# Patient Record
Sex: Male | Born: 2000 | Race: Black or African American | Hispanic: No | Marital: Single | Smoking: Never smoker
Health system: Southern US, Community
[De-identification: ages and names within clinical notes are randomized; demographics above are authoritative.]

## PROBLEM LIST (undated history)

## (undated) DIAGNOSIS — J45909 Unspecified asthma, uncomplicated: Secondary | ICD-10-CM

---
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
## 2018-03-11 ENCOUNTER — Encounter: Payer: Self-pay | Admitting: Pediatrics

## 2018-03-11 ENCOUNTER — Ambulatory Visit (INDEPENDENT_AMBULATORY_CARE_PROVIDER_SITE_OTHER): Payer: Medicaid Other | Admitting: Pediatrics

## 2018-03-11 VITALS — Temp 98.5°F | Wt 220.8 lb

## 2018-03-11 DIAGNOSIS — J301 Allergic rhinitis due to pollen: Secondary | ICD-10-CM

## 2018-03-11 DIAGNOSIS — R300 Dysuria: Secondary | ICD-10-CM

## 2018-03-11 DIAGNOSIS — R21 Rash and other nonspecific skin eruption: Secondary | ICD-10-CM

## 2018-03-11 DIAGNOSIS — R369 Urethral discharge, unspecified: Secondary | ICD-10-CM

## 2018-03-11 MED ORDER — CEFTRIAXONE SODIUM 1 G IJ SOLR
250.0000 mg | Freq: Once | INTRAMUSCULAR | Status: AC
  Administered 2018-03-11: 250 mg via INTRAMUSCULAR

## 2018-03-11 MED ORDER — PENICILLIN G BENZATHINE 1200000 UNIT/2ML IM SUSP
2.4000 10*6.[IU] | Freq: Once | INTRAMUSCULAR | Status: AC
  Administered 2018-03-11: 2.4 10*6.[IU] via INTRAMUSCULAR

## 2018-03-11 MED ORDER — AZITHROMYCIN 500 MG PO TABS
1000.0000 mg | ORAL_TABLET | Freq: Once | ORAL | Status: AC
  Administered 2018-03-11: 1000 mg via ORAL

## 2018-03-11 MED ORDER — AZITHROMYCIN 250 MG PO TABS
1000.0000 mg | ORAL_TABLET | Freq: Every day | ORAL | Status: DC
  Administered 2018-03-11: 1000 mg via ORAL

## 2018-03-11 MED ORDER — CETIRIZINE HCL 10 MG PO TABS: 10.0000 mg | ORAL_TABLET | Freq: Every day | ORAL | 5 refills | Status: DC

## 2018-03-11 MED ORDER — FLUTICASONE PROPIONATE 50 MCG/ACT NA SUSP: 1.0000 | Freq: Every day | NASAL | 5 refills | Status: AC

## 2018-03-11 NOTE — Progress Notes (Signed)
Subjective:     Brian Gamble, is a 17 y.o. male  HPI  Chief Complaint  Patient presents with  . Foot    Both for a 2 weeks now  . 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  HENT: Negative for mouth sores, rhinorrhea and sore throat.   Eyes: Negative for discharge and redness.  Respiratory: Negative for cough.   Gastrointestinal: Negative for diarrhea and vomiting.  Genitourinary: Positive for discharge and urgency. Negative for flank pain and hematuria.  Musculoskeletal: Positive for back pain.  Neurological: Negative for dizziness and headaches.    History and Problem List: Ernesto does not have any active problems on file.  Kanon  has a past medical history of Asthma.  The following portions of the patient's history were reviewed and updated as appropriate: allergies, current medications, past family history, past medical history, past social history, past surgical history and problem list.     Objective:     Temp 98.5 F (36.9 C) (Temporal)   Wt 220 lb 12.8 oz (100.2 kg)    Physical Exam  Constitutional: He appears well-developed and  well-nourished. No distress.  HENT:  Head: Normocephalic and atraumatic.  Nose: Nose normal.  Mouth/Throat: Oropharynx is clear and moist.  Eyes: Conjunctivae and EOM are normal. Right eye exhibits no discharge. Left eye exhibits no discharge.  Neck: Normal range of motion. No thyromegaly present.  Cardiovascular: Normal rate, regular rhythm and normal heart sounds.  No murmur heard. Pulmonary/Chest: No respiratory distress. He has no wheezes. He has no rales.  Abdominal: Soft. He exhibits no distension. There is no tenderness.  Genitourinary:  Genitourinary Comments: waterry penile discharge, erythema at urethral opening, no other lesions, no inguinal nodes   Lymphadenopathy:    He has no cervical adenopathy.  Skin: Skin is warm and dry. Rash noted.  No rash except soles and possibly palms: 1 cm macules, hyperpigmented, no pustules covering soles,        Assessment & Plan:   1. Penile discharge Probably STI No known exposure  - HIV antibody - RPR - C. trachomatis/N. gonorrhoeae RNA  IM Ceftriazone 250 mg in clinic Azithromycin 10 mg oral--in clinic  2. Dysuria  3. Seasonal allergic rhinitis due to pollen rx for Cetirizine refilled For Flonase reviewed use, rx flonase  - cetirizine (ZYRTEC) 10 MG tablet; Take 1 tablet (10 mg total) by mouth daily.  Dispense: 30 tablet; Refill: 5  5. Rash in adult On soles, ddxn: secondary syphilis, gonorrhea bacterial endocarditis, Unlikely endocarditis without murmur or systemic findings  Plan treatment for IM PCN today,  if positive PRP will need to follow titers to confirm secondary syphyllis appropriately treated, refer to public health and re-treat with weekly PCN IM  Discussed condoms,  Potential for public health referral    [phone] for results  Supportive care and return precautions reviewed.  Spent  25  minutes face to face time with patient; greater than 50% spent in counseling regarding diagnosis and treatment  plan.   Theadore Nan, MD

## 2018-03-11 NOTE — Patient Instructions (Addendum)
The best sources of general information are www.kidshealth.org and www.healthychildren.org   Both have excellent, accurate information about many topics.  !Tambien en espanol!  Use information on the internet only from trusted sites.The best websites for information for teenagers are www.youngwomensheatlh.org and www.youngmenshealthsite.org       Good video of parent-teen talk about sex and sexuality is at www.plannedparenthood.org/parents/talking-to0-kids-about-sex-and-sexuality  Excellent information about birth control is available at www.plannedparenthood.org/health-info/birth-control  Consider letting your partners know that they have been exposed to a sexually transmitted infection at the website: dontspreadit.com  You put in an email address or phone number of a partner and the website lets them know anonymously.  For Allergies:  Cetirizine works well for as need for symptoms and is not a controller medicine  Flonase in the nose helps for as needed daily symptoms and also helps to prevent allergies if used daily.   These can all be used only during allergy season

## 2018-03-14 LAB — HIV ANTIBODY (ROUTINE TESTING W REFLEX): HIV 1&2 Ab, 4th Generation: NONREACTIVE

## 2018-03-14 LAB — C. TRACHOMATIS/N. GONORRHOEAE RNA
C. TRACHOMATIS RNA, TMA: DETECTED — AB
N. gonorrhoeae RNA, TMA: DETECTED — AB

## 2018-03-14 LAB — RPR: RPR: NONREACTIVE

## 2018-03-14 NOTE — Progress Notes (Signed)
Brian Gamble notified of lab results. His feet are feeling better. Instructed to call [facility] if not completely better in one week.  Understanding stated.

## 2018-03-31 ENCOUNTER — Encounter: Payer: Self-pay | Admitting: Pediatrics

## 2018-03-31 ENCOUNTER — Ambulatory Visit (INDEPENDENT_AMBULATORY_CARE_PROVIDER_SITE_OTHER): Payer: Medicaid Other | Admitting: Pediatrics

## 2018-03-31 VITALS — Temp 97.9°F | Wt 204.0 lb

## 2018-03-31 DIAGNOSIS — R634 Abnormal weight loss: Secondary | ICD-10-CM

## 2018-03-31 DIAGNOSIS — R3129 Other microscopic hematuria: Secondary | ICD-10-CM | POA: Diagnosis not present

## 2018-03-31 DIAGNOSIS — R062 Wheezing: Secondary | ICD-10-CM

## 2018-03-31 DIAGNOSIS — K529 Noninfective gastroenteritis and colitis, unspecified: Secondary | ICD-10-CM | POA: Diagnosis not present

## 2018-03-31 LAB — CBC WITH DIFFERENTIAL/PLATELET
BASOS ABS: 28 {cells}/uL (ref 0–200)
Basophils Relative: 0.5 %
Eosinophils Absolute: 99 cells/uL (ref 15–500)
Eosinophils Relative: 1.8 %
HEMATOCRIT: 45.4 % (ref 38.5–50.0)
HEMOGLOBIN: 16.5 g/dL (ref 13.2–17.1)
LYMPHS ABS: 1628 {cells}/uL (ref 850–3900)
MCH: 30.7 pg (ref 27.0–33.0)
MCHC: 36.3 g/dL — ABNORMAL HIGH (ref 32.0–36.0)
MCV: 84.4 fL (ref 80.0–100.0)
MPV: 11 fL (ref 7.5–12.5)
Monocytes Relative: 11.6 %
NEUTROS ABS: 3108 {cells}/uL (ref 1500–7800)
NEUTROS PCT: 56.5 %
Platelets: 228 10*3/uL (ref 140–400)
RBC: 5.38 10*6/uL (ref 4.20–5.80)
RDW: 12 % (ref 11.0–15.0)
Total Lymphocyte: 29.6 %
WBC: 5.5 10*3/uL (ref 3.8–10.8)
WBCMIX: 638 {cells}/uL (ref 200–950)

## 2018-03-31 LAB — COMPREHENSIVE METABOLIC PANEL
AG RATIO: 2 (calc) (ref 1.0–2.5)
ALBUMIN MSPROF: 4.3 g/dL (ref 3.6–5.1)
ALKALINE PHOSPHATASE (APISO): 83 U/L (ref 48–230)
ALT: 14 U/L (ref 8–46)
AST: 19 U/L (ref 12–32)
BILIRUBIN TOTAL: 0.6 mg/dL (ref 0.2–1.1)
BUN: 13 mg/dL (ref 7–20)
CHLORIDE: 101 mmol/L (ref 98–110)
CO2: 28 mmol/L (ref 20–32)
Calcium: 9.4 mg/dL (ref 8.9–10.4)
Creat: 1.25 mg/dL (ref 0.60–1.26)
Globulin: 2.1 g/dL (calc) (ref 2.1–3.5)
Glucose, Bld: 78 mg/dL (ref 65–99)
POTASSIUM: 4.1 mmol/L (ref 3.8–5.1)
SODIUM: 139 mmol/L (ref 135–146)
TOTAL PROTEIN: 6.4 g/dL (ref 6.3–8.2)

## 2018-03-31 LAB — POCT URINALYSIS DIPSTICK
Bilirubin, UA: NEGATIVE
GLUCOSE UA: NEGATIVE
KETONES UA: NEGATIVE
Leukocytes, UA: NEGATIVE
Nitrite, UA: NEGATIVE
Protein, UA: POSITIVE — AB
SPEC GRAV UA: 1.02 (ref 1.010–1.025)
Urobilinogen, UA: 4 E.U./dL — AB
pH, UA: 6 (ref 5.0–8.0)

## 2018-03-31 MED ORDER — AEROCHAMBER PLUS FLO-VU MEDIUM MISC
1.0000 | Freq: Once | Status: AC
  Administered 2018-03-31: 1

## 2018-03-31 MED ORDER — ALBUTEROL SULFATE HFA 108 (90 BASE) MCG/ACT IN AERS: 2.0000 | INHALATION_SPRAY | RESPIRATORY_TRACT | 1 refills | Status: DC | PRN

## 2018-03-31 MED ORDER — ONDANSETRON 8 MG PO TBDP: 8.0000 mg | ORAL_TABLET | Freq: Three times a day (TID) | ORAL | 0 refills | Status: DC | PRN

## 2018-03-31 MED ORDER — ALBUTEROL SULFATE (2.5 MG/3ML) 0.083% IN NEBU
2.5000 mg | INHALATION_SOLUTION | Freq: Once | RESPIRATORY_TRACT | Status: AC
  Administered 2018-03-31: 2.5 mg via RESPIRATORY_TRACT

## 2018-03-31 NOTE — Progress Notes (Signed)
4

## 2018-03-31 NOTE — Patient Instructions (Signed)
Use the albuterol inhaler with spacer if wheezing or coughing.  Take the Ondansetron if you have further nausea, vomiting.  Wait 20 minutes, then start sips of clear liquids and gradually advance on quantity.   Goal is to drink one of the liter size (usually costs $1) Gatorade before bedtime tonight and at 1/2 that amount of water.  If you have increased pain or still not tolerating fluids, or other worries tonight that need medical attention, seek care at [facility]  Please keep your appointment with us for tomorrow.

## 2018-03-31 NOTE — Progress Notes (Signed)
Subjective:    Patient ID: Gardiner RamusKrystofer Aung, male    DOB: 07/08/2001, 17 y.o.   MRN: [id number]  HPI Cristal DeerChristopher is here with concern of vomiting, diarrhea and abdominal pain for 4 days. He states diarrhea twice today, improved so far from 3 times yesterday.  No blood in stool.  Report eating 3 pcs of chicken today and drinking 1 gallon of water over course of day, but vomiting it all back.  States smell of food creates nausea.  Voided 3 times so far today and recalls voiding 3 times yesterday. No medication or modifying factors. No fever.  Some abdominal pain. Occasional cough but denies feeling SOB or having chest pain. No known illness exposures. Stopped smoking 3 months ago. 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  Jacque states his other concern is weight loss.  Reports what he thinks is about a 40 pound unintended wt loss this year.  He states he normally has a big appetite but has not been able to eat much.  He is concerned.  Chart review shows recent visit for STI diagnosis and treatment.  States his partner also received treatment, 2 days later.  States no other partners and they live together.   Labs are in EHR and reviewed.  PMH, problem list, medications and allergies, family and social history reviewed and updated as indicated.  He lives with his mother, sister, his girlfriend and their 305 month old child.   Review of Systems As noted in HPI    Objective:   Physical Exam  Constitutional: He appears well-developed and well-nourished.  HENT:  Right Ear: External ear normal.  Left Ear: External ear normal.  Mouth/Throat: Oropharynx is clear and moist. No oropharyngeal exudate.  Eyes: Conjunctivae are normal. Right eye exhibits no discharge. Left eye exhibits no discharge.  Neck: Normal range of motion. Neck supple.  Cardiovascular: Normal rate, regular rhythm and normal heart sounds.  No murmur heard. Pulmonary/Chest: Effort  normal. No respiratory distress. He has wheezes. He has rales.  Good air movement but wheezes and crackles bilaterally that improve but persist after cough  Abdominal: Soft. Bowel sounds are normal. He exhibits no distension and no mass. There is tenderness (states mild tenderness on palpation in RLQ and suprapubic area; no rebound or mass). There is no rebound and no guarding. No hernia.  Skin: Skin is warm and dry.  Nursing note and vitals reviewed.  Re-examined after albuterol neb treatment with continued crackles on the left, clear on the right.  Temperature 97.9 F (36.6 C), temperature source Temporal, weight 204 lb (92.5 kg). Wt Readings from Last 3 Encounters:  03/31/18 204 lb (92.5 kg) (94 %, Z= 1.55)*  03/11/18 220 lb 12.8 oz (100.2 kg) (97 %, Z= 1.91)*  05/03/17 236 lb (107 kg) (99 %, Z= 2.23)*   * Growth percentiles are based on CDC (Boys, 2-20 Years) data.   Results for orders placed or performed in visit on 03/31/18 (from the past 48 hour(s))  POCT urinalysis dipstick     Status: Abnormal   Collection Time: 03/31/18  5:00 PM  Result Value Ref Range   Color, UA dark yellow    Clarity, UA cloudy    Glucose, UA Negative Negative   Bilirubin, UA negative    Ketones, UA negative    Spec Grav, UA 1.020 1.010 - 1.025   Blood, UA +++    pH, UA 6.0 5.0 - 8.0  Protein, UA Positive (A) Negative   Urobilinogen, UA 4.0 (A) 0.2 or 1.0 E.U./dL   Nitrite, UA negative    Leukocytes, UA Negative Negative   Appearance     Odor        Assessment & Plan:   1. Wheezing Wheezes and rales improved a little after albuterol.  He did not show any distress on exam and stated he felt okay at home.  Concern for viral illness. Advised on use of albuterol tonight. Will reassess tomorrow and consider CXR. - albuterol (PROVENTIL) (2.5 MG/3ML) 0.083% nebulizer solution 2.5 mg - albuterol (PROVENTIL HFA;VENTOLIN HFA) 108 (90 Base) MCG/ACT inhaler; Inhale 2 puffs into the lungs every 4 (four)  hours as needed for wheezing. Use with spacer  Dispense: 1 Inhaler; Refill: 1 - AEROCHAMBER PLUS FLO-VU MEDIUM MISC 1 each  2. Gastroenteritis Discussed management of vomiting and importance of hydration. He voiced understanding and ability to follow through. - ondansetron (ZOFRAN-ODT) 8 MG disintegrating tablet; Take 1 tablet (8 mg total) by mouth every 8 (eight) hours as needed for nausea or vomiting.  Dispense: 10 tablet; Refill: 0  3. Unintended weight loss Patient appears at healthy weight; however, this is my first contact with him.  He certainly voices anxiety about his weight.  Given other health concerns this and recent visits, will check CBC and CMP.  Urine was abnormal on dip in office and further work up initiated. - POCT urinalysis dipstick - CBC with Differential/Platelet - Comprehensive metabolic panel  4. Hematuria, microscopic Abnormal findings on UA.  Patient is dehydrated from acute illness.  Advised on hydration tonight and more fluids in am.  Urine sent for microscopic.  Will re-examine in the morning at follow up. - Urine Culture - Urine Microscopic  Maree Erie, MD

## 2018-04-01 ENCOUNTER — Encounter: Payer: Self-pay | Admitting: Pediatrics

## 2018-04-01 ENCOUNTER — Ambulatory Visit (INDEPENDENT_AMBULATORY_CARE_PROVIDER_SITE_OTHER): Payer: Medicaid Other | Admitting: Pediatrics

## 2018-04-01 VITALS — BP 120/60 | Temp 97.7°F | Wt 205.0 lb

## 2018-04-01 DIAGNOSIS — R319 Hematuria, unspecified: Secondary | ICD-10-CM

## 2018-04-01 DIAGNOSIS — R109 Unspecified abdominal pain: Secondary | ICD-10-CM

## 2018-04-01 DIAGNOSIS — R062 Wheezing: Secondary | ICD-10-CM | POA: Diagnosis not present

## 2018-04-01 DIAGNOSIS — R634 Abnormal weight loss: Secondary | ICD-10-CM

## 2018-04-01 LAB — URINALYSIS, MICROSCOPIC ONLY: Bacteria, UA: NONE SEEN /HPF

## 2018-04-01 LAB — URINE CULTURE
MICRO NUMBER:: 90739873
Result:: NO GROWTH
SPECIMEN QUALITY:: ADEQUATE

## 2018-04-01 LAB — POCT URINALYSIS DIPSTICK
Bilirubin, UA: NEGATIVE
Blood, UA: NEGATIVE
GLUCOSE UA: NEGATIVE
KETONES UA: NEGATIVE
Nitrite, UA: NEGATIVE
Protein, UA: NEGATIVE
SPEC GRAV UA: 1.02 (ref 1.010–1.025)
Urobilinogen, UA: 4 E.U./dL — AB
pH, UA: 6 (ref 5.0–8.0)

## 2018-04-01 NOTE — Patient Instructions (Signed)
Good to see you today! Thank you for coming in.   Please come back if you are still coughing in 5 days  Your labs all look good.

## 2018-04-01 NOTE — Progress Notes (Signed)
Subjective:     Brian Gamble, is a 17 y.o. male  HPI  Chief Complaint  Patient presents with  . Follow-up  seen yesterday for wheezing and abd pain Was also concerned for unintentional weight loss of 40 pounds And seen 5/31 for both Chlamydia and GC-treated Also had foot rash   Foot rash: no longer hurt, no more rash  abd pain Not as bad as yesterday  No more vomiting or diarrhea Never had a fever Cough is getting better  Weight loss Weight loss was up to 260 when was playing football for fall of 2018 Our documented weight was 236 in 04/2017,  No been eating well for 4-5 days Poor appetite this week Was eating well before this illness, has a normal appetite before that Working is more tiring than working out  To [place] in the fall for college--needs Imm record  To leave July 7th Will be playing football there again,    Had asthma in past but not in [place] (was in [place]) and not for 7 years Using MDI and spacer since left clinic yesterday  Got last night and this morning,   Urine Output decreased?: UOP this morning and here   Review of Systems  History and Problem List: Jailen does not have any active problems on file.  Kaenan  has a past medical history of Asthma.  The following portions of the patient's history were reviewed and updated as appropriate: allergies, current medications, past family history, past medical history, past social history, past surgical history and problem list.     Objective:     BP 120/60   Temp 97.7 F (36.5 C) (Temporal)   Wt 205 lb (93 kg)    Physical Exam  Constitutional: He appears well-developed and well-nourished.  HENT:  Head: Normocephalic and atraumatic.  Nose: Nose normal.  Mouth/Throat: Oropharynx is clear and moist.  Eyes: Conjunctivae and EOM are normal. Right eye exhibits no discharge. Left eye exhibits no discharge.  Neck: Normal range of motion. No thyromegaly present.  Cardiovascular: Normal  rate, regular rhythm and normal heart sounds.  No murmur heard. Pulmonary/Chest: No respiratory distress. He has wheezes. He has no rales.  Wheeze in lower left lobe more than other areas, no rales, no retractions, occasional cough  Abdominal: Soft. He exhibits no distension. There is no tenderness.  Lymphadenopathy:    He has no cervical adenopathy.  Skin: Skin is warm and dry. No rash noted.       Assessment & Plan:   1. Wheezing Improved, but not resolved, Mild, but focal Considered CXR, but patient does not plan to obtain one today,  If not better in 5 days, will return to clinic and will re-consider need for CXR  2. Abdominal pain, unspecified abdominal location Improved, less vomiting and diarrhea  3. Hematuria, unspecified type Repeat UA today no blood Trace LE could be attributed to current illness or recent penile discharge panel  - POCT urinalysis dipstick  4. Weight loss Using prior ht, his BMI is 26, which is just about 85%/ overweight for age  I am reassured by the negative screening labs yesterday including normal CBC and CMP He had hematuria yesterday today just has LE positive. Given recent penile discharge, diarrhea, would repeat UA again before considering him to have renal pathology he reports he typically has a good appetite and has not been working out like he should be. He still appears to be very muscular and not wasted on exam  No further evaluation today for reassess.  He will be continuing to monitor his weight as he enters into football training again later this summer The absence of fever and his recent illness and a recently negative HIV is reassuring    Supportive care and return precautions reviewed.  Spent  25  minutes face to face time with patient; greater than 50% spent in counseling regarding diagnosis and treatment plan.   Theadore NanHilary Brycelyn Gambino, MD

## 2018-10-10 ENCOUNTER — Ambulatory Visit: Payer: Medicaid Other | Admitting: Pediatrics

## 2018-10-17 ENCOUNTER — Ambulatory Visit (HOSPITAL_COMMUNITY)
Admission: EM | Admit: 2018-10-17 | Discharge: 2018-10-17 | Discharge status: Against Medical Advice | Payer: Medicaid Other

## 2018-10-18 ENCOUNTER — Encounter (HOSPITAL_COMMUNITY): Payer: Self-pay

## 2018-10-18 ENCOUNTER — Ambulatory Visit (HOSPITAL_COMMUNITY)
Admission: EM | Admit: 2018-10-18 | Discharge: 2018-10-18 | Disposition: A | Discharge status: Home / Self Care | Payer: Medicaid Other | Attending: Emergency Medicine | Admitting: Emergency Medicine

## 2018-10-18 DIAGNOSIS — Z113 Encounter for screening for infections with a predominantly sexual mode of transmission: Secondary | ICD-10-CM | POA: Diagnosis not present

## 2018-10-18 DIAGNOSIS — Z202 Contact with and (suspected) exposure to infections with a predominantly sexual mode of transmission: Secondary | ICD-10-CM | POA: Diagnosis not present

## 2018-10-18 MED ORDER — AZITHROMYCIN 250 MG PO TABS
1000.0000 mg | ORAL_TABLET | Freq: Once | ORAL | Status: AC
  Administered 2018-10-18: 1000 mg via ORAL

## 2018-10-18 MED ORDER — LIDOCAINE HCL (PF) 1 % IJ SOLN
INTRAMUSCULAR | Status: AC
  Filled 2018-10-18: qty 2

## 2018-10-18 MED ORDER — CEFTRIAXONE SODIUM 250 MG IJ SOLR
INTRAMUSCULAR | Status: AC
  Filled 2018-10-18: qty 250

## 2018-10-18 MED ORDER — AZITHROMYCIN 250 MG PO TABS
ORAL_TABLET | ORAL | Status: AC
  Filled 2018-10-18: qty 4

## 2018-10-18 MED ORDER — CEFTRIAXONE SODIUM 250 MG IJ SOLR
250.0000 mg | Freq: Once | INTRAMUSCULAR | Status: AC
  Administered 2018-10-18: 250 mg via INTRAMUSCULAR

## 2018-10-18 NOTE — ED Triage Notes (Signed)
Pt presents for STD treatment after exposure from partner.  Pt states he is having penile discharge.

## 2018-10-18 NOTE — Discharge Instructions (Addendum)
Continue using condoms on a regular basis.  We will call you if any of your labs come back positive especially if they require further treatment.  Do not engage in intercourse for the next week to 10 days or until your symptoms resolve and you know that your partner has been treated for chlamydia.  You can call here in several days and get your results if you want to.

## 2018-10-18 NOTE — ED Provider Notes (Signed)
HPI  SUBJECTIVE:  Brian Gamble is a 18 y.o. male who presents with clear penile discharge.  Patient has a new male sexual partner who was diagnosed with chlamydia.  States that the condom broke.  He denies having any other sexual partners.  He denies fevers, abdominal pain, back, pelvic pain, testicular, scrotal pain or swelling, penile rash.  No aggravating or alleviating factors.  He has not tried anything for this.  He has a past medical history of gonorrhea, chlamydia, asthma.  No history of HIV, HSV, syphilis, trichomonas.  PMD Theadore Nan, MD   Past Medical History:  Diagnosis Date  . Asthma     History reviewed. No pertinent surgical history.  Family History  Problem Relation Age of Onset  . Arthritis Mother   . Asthma Mother   . Drug abuse Neg Hx   . Alcohol abuse Neg Hx   . Learning disabilities Neg Hx   . Kidney disease Neg Hx   . Heart disease Neg Hx     Social History   Tobacco Use  . Smoking status: Never Smoker  . Smokeless tobacco: Never Used  Substance Use Topics  . Alcohol use: No  . Drug use: Not on file    No current facility-administered medications for this encounter.   Current Outpatient Medications:  .  albuterol (PROVENTIL HFA;VENTOLIN HFA) 108 (90 Base) MCG/ACT inhaler, Inhale 2 puffs into the lungs every 4 (four) hours as needed for wheezing. Use with spacer, Disp: 1 Inhaler, Rfl: 1 .  cetirizine (ZYRTEC) 10 MG tablet, Take 1 tablet (10 mg total) by mouth daily., Disp: 30 tablet, Rfl: 5 .  fluticasone (FLONASE) 50 MCG/ACT nasal spray, Place 1 spray into both nostrils daily. 1 spray in each nostril every day, Disp: 16 g, Rfl: 5  No Known Allergies   ROS  As noted in HPI.   Physical Exam  BP 128/67 (BP Location: Left Arm)   Pulse 66   Temp 98.8 F (37.1 C) (Oral)   Resp 20   SpO2 100%   Constitutional: Well developed, well nourished, no acute distress Eyes:  EOMI, conjunctiva normal bilaterally HENT: Normocephalic,  atraumatic,mucus membranes moist Respiratory: Normal inspiratory effort Cardiovascular: Normal rate GI: nondistended Back: No CVAT GU: Normal circumcised male, testes descended bilaterally.  Nontender, normal testicles.  No epididymal swelling or tenderness.  No penile rash, discharge.  Patient declined chaperone. Lymph: No inguinal lymphadenopathy. skin: No rash, skin intact Musculoskeletal: no deformities Neurologic: Alert & oriented x 3, no focal neuro deficits Psychiatric: Speech and behavior appropriate   ED Course   Medications  cefTRIAXone (ROCEPHIN) injection 250 mg (250 mg Intramuscular Given 10/18/18 1051)  azithromycin (ZITHROMAX) tablet 1,000 mg (1,000 mg Oral Given 10/18/18 1051)    No orders of the defined types were placed in this encounter.   No results found for this or any previous visit (from the past 24 hour(s)). No results found.  ED Clinical Impression  Exposure to chlamydia  Screen for STD (sexually transmitted disease)   ED Assessment/Plan  will treat presumptively for both gonorrhea and chlamydia with Rocephin 250 mg IM and azithromycin 1000 mg p.o. x1.  Sending urine off for gonorrhea, chlamydia, trichomonas.  Advised patient to give [place] a working phone number so we can contact him if any of his other labs come back positive. pt declined testing for HIV, syphilis.  States that he tested negative for both of these while he has been with his current sexual partner.  Continue using  condoms on a regular basis.  Follow-up with PMD as needed.   Meds ordered this encounter  Medications  . cefTRIAXone (ROCEPHIN) injection 250 mg  . azithromycin (ZITHROMAX) tablet 1,000 mg    *This clinic note was created using Scientist, clinical (histocompatibility and immunogenetics). Therefore, there may be occasional mistakes despite careful proofreading.   ?    Domenick Gong, MD 10/19/18 1311

## 2018-10-19 LAB — URINE CYTOLOGY ANCILLARY ONLY
Chlamydia: POSITIVE — AB
Neisseria Gonorrhea: NEGATIVE
Trichomonas: NEGATIVE

## 2018-10-23 ENCOUNTER — Telehealth (HOSPITAL_COMMUNITY): Payer: Self-pay | Admitting: Emergency Medicine

## 2018-10-23 NOTE — Telephone Encounter (Signed)
Chlamydia is positive.  This was treated at the urgent care visit with po zithromax 1g.  Pt needs education to please refrain from sexual intercourse for 7 days to give the medicine time to work.  Sexual partners need to be notified and tested/treated.  Condoms may reduce risk of reinfection.  Recheck or followup with PCP for further evaluation if symptoms are not improving.  [facility] notified.  Patient contacted and made aware, all questions answered.

## 2018-10-26 ENCOUNTER — Ambulatory Visit: Payer: Medicaid Other | Admitting: Pediatrics

## 2019-01-16 ENCOUNTER — Ambulatory Visit (HOSPITAL_COMMUNITY)
Admission: EM | Admit: 2019-01-16 | Discharge: 2019-01-16 | Disposition: A | Discharge status: Home / Self Care | Payer: Medicaid Other | Attending: Family Medicine | Admitting: Family Medicine

## 2019-01-16 ENCOUNTER — Ambulatory Visit: Payer: Medicaid Other | Admitting: Pediatrics

## 2019-01-16 ENCOUNTER — Encounter: Payer: Self-pay | Admitting: Pediatrics

## 2019-01-16 ENCOUNTER — Other Ambulatory Visit: Payer: Self-pay

## 2019-01-16 ENCOUNTER — Encounter (HOSPITAL_COMMUNITY): Payer: Self-pay

## 2019-01-16 DIAGNOSIS — Z0289 Encounter for other administrative examinations: Secondary | ICD-10-CM

## 2019-01-16 DIAGNOSIS — Z711 Person with feared health complaint in whom no diagnosis is made: Secondary | ICD-10-CM

## 2019-01-16 DIAGNOSIS — R109 Unspecified abdominal pain: Secondary | ICD-10-CM

## 2019-01-16 NOTE — ED Triage Notes (Signed)
Pt cc he needs a work note. Pt states he had vomited once so he called out of work on Friday. Because he drank some milk at home that made him vomit.

## 2019-01-16 NOTE — ED Provider Notes (Signed)
[facility]    CSN: [id number] Arrival date & time: 01/16/19  1906     History   Chief Complaint Chief Complaint  Patient presents with  . work note    HPI Brian Gamble is a 18 y.o. male.   18 year old male comes in for work note to return to work. States 3 days ago, was drinking two different milk and threw up, and therefore called out of work. He has since then been asymptomatic. Has not had any nausea, vomiting, diarrhea, abdominal pain. Denies fever, chills, night sweats. Denies URI symptoms such as cough, congestion, sore throat. Has been eating and drinking without difficulty.      Past Medical History:  Diagnosis Date  . Asthma     There are no active problems to display for this patient.   History reviewed. No pertinent surgical history.     Home Medications    Prior to Admission medications   Medication Sig Start Date End Date Taking? Authorizing Provider  albuterol (PROVENTIL HFA;VENTOLIN HFA) 108 (90 Base) MCG/ACT inhaler Inhale 2 puffs into the lungs every 4 (four) hours as needed for wheezing. Use with spacer 03/31/18   Maree Erie, MD  cetirizine (ZYRTEC) 10 MG tablet Take 1 tablet (10 mg total) by mouth daily. 03/11/18   Theadore Nan, MD  fluticasone (FLONASE) 50 MCG/ACT nasal spray Place 1 spray into both nostrils daily. 1 spray in each nostril every day 03/11/18   Theadore Nan, MD    Family History Family History  Problem Relation Age of Onset  . Arthritis Mother   . Asthma Mother   . Drug abuse Neg Hx   . Alcohol abuse Neg Hx   . Learning disabilities Neg Hx   . Kidney disease Neg Hx   . Heart disease Neg Hx     Social History Social History   Tobacco Use  . Smoking status: Never Smoker  . Smokeless tobacco: Never Used  Substance Use Topics  . Alcohol use: No  . Drug use: Not on file     Allergies   Patient has no known allergies.   Review of Systems Review of Systems  Reason unable to perform  ROS: See HPI as above.     Physical Exam Triage Vital Signs ED Triage Vitals  Enc Vitals Group     BP 01/16/19 1927 128/69     Pulse Rate 01/16/19 1927 69     Resp 01/16/19 1927 18     Temp 01/16/19 1927 98.5 F (36.9 C)     Temp Source 01/16/19 1927 Oral     SpO2 01/16/19 1927 98 %     Weight 01/16/19 1925 225 lb (102.1 kg)     Height --      Head Circumference --      Peak Flow --      Pain Score 01/16/19 1925 0     Pain Loc --      Pain Edu? --      Excl. in [place]? --    No data found.  Updated Vital Signs BP 128/69 (BP Location: Right Arm)   Pulse 69   Temp 98.5 F (36.9 C) (Oral)   Resp 18   Wt 225 lb (102.1 kg)   SpO2 98%   Visual Acuity Right Eye Distance:   Left Eye Distance:   Bilateral Distance:    Right Eye Near:   Left Eye Near:    Bilateral Near:  Physical Exam Constitutional:      General: He is not in acute distress.    Appearance: He is well-developed.  HENT:     Head: Normocephalic and atraumatic.  Cardiovascular:     Rate and Rhythm: Normal rate and regular rhythm.     Heart sounds: Normal heart sounds. No murmur. No friction rub. No gallop.   Pulmonary:     Effort: Pulmonary effort is normal.     Breath sounds: Normal breath sounds. No wheezing or rales.  Abdominal:     General: Bowel sounds are normal.     Palpations: Abdomen is soft.     Tenderness: There is no abdominal tenderness. There is no right CVA tenderness, left CVA tenderness, guarding or rebound.  Skin:    General: Skin is warm and dry.  Neurological:     Mental Status: He is alert and oriented to person, place, and time.  Psychiatric:        Behavior: Behavior normal.        Judgment: Judgment normal.      UC Treatments / Results  Labs (all labs ordered are listed, but only abnormal results are displayed) Labs Reviewed - No data to display  EKG None  Radiology No results found.  Procedures Procedures (including critical care time)  Medications  Ordered in UC Medications - No data to display  Initial Impression / Assessment and Plan / UC Course  I have reviewed the triage vital signs and the nursing notes.  Pertinent labs & imaging results that were available during my care of the patient were reviewed by me and considered in my medical decision making (see chart for details).    Normal exam. Patient had 1 episode of vomiting and since then has been asymptomatic. Able to tolerate oral intake. Can return to work.  Final Clinical Impressions(s) / UC Diagnoses   Final diagnoses:  Worried well    ED Prescriptions    None        Belinda Fisher, PA-C 01/16/19 1950

## 2019-01-16 NOTE — Discharge Instructions (Signed)
Normal exam. Able to return to work.

## 2019-01-16 NOTE — Progress Notes (Signed)
Visit by telephone note [phone] No answer at 1:33 PM; left message that MD will try to call again 2nd call at 3:12 PM. Left message, may try to call once more this afternoon. Never connected.  Left message for patient to call again with ongoing concern. No charge.   Leda Min, MD

## 2019-02-28 ENCOUNTER — Other Ambulatory Visit: Payer: Self-pay

## 2019-02-28 ENCOUNTER — Ambulatory Visit: Payer: Medicaid Other | Admitting: Pediatrics

## 2019-02-28 ENCOUNTER — Telehealth: Payer: Self-pay | Admitting: *Deleted

## 2019-02-28 NOTE — Telephone Encounter (Signed)
LVM for patient to call back to do the [facility]  screening questions

## 2019-03-07 ENCOUNTER — Encounter: Payer: Self-pay | Admitting: Pediatrics

## 2019-03-07 ENCOUNTER — Other Ambulatory Visit: Payer: Self-pay

## 2019-03-07 ENCOUNTER — Ambulatory Visit (INDEPENDENT_AMBULATORY_CARE_PROVIDER_SITE_OTHER): Payer: Medicaid Other | Admitting: Pediatrics

## 2019-03-07 VITALS — BP 116/70 | Ht 73.5 in | Wt 252.6 lb

## 2019-03-07 DIAGNOSIS — J301 Allergic rhinitis due to pollen: Secondary | ICD-10-CM | POA: Diagnosis not present

## 2019-03-07 DIAGNOSIS — E669 Obesity, unspecified: Secondary | ICD-10-CM

## 2019-03-07 DIAGNOSIS — Z6832 Body mass index (BMI) 32.0-32.9, adult: Secondary | ICD-10-CM | POA: Diagnosis not present

## 2019-03-07 DIAGNOSIS — Z113 Encounter for screening for infections with a predominantly sexual mode of transmission: Secondary | ICD-10-CM

## 2019-03-07 DIAGNOSIS — Z0001 Encounter for general adult medical examination with abnormal findings: Secondary | ICD-10-CM | POA: Diagnosis not present

## 2019-03-07 DIAGNOSIS — R3121 Asymptomatic microscopic hematuria: Secondary | ICD-10-CM

## 2019-03-07 LAB — POCT URINALYSIS DIPSTICK
Bilirubin, UA: NEGATIVE
Blood, UA: NEGATIVE
Glucose, UA: NEGATIVE
Ketones, UA: NEGATIVE
Leukocytes, UA: NEGATIVE
Nitrite, UA: NEGATIVE
Protein, UA: POSITIVE — AB
Spec Grav, UA: 1.02 (ref 1.010–1.025)
Urobilinogen, UA: 1 E.U./dL
pH, UA: 5.5 (ref 5.0–8.0)

## 2019-03-07 LAB — POCT RAPID HIV: Rapid HIV, POC: NEGATIVE

## 2019-03-07 MED ORDER — CETIRIZINE HCL 10 MG PO TABS: 10.0000 mg | ORAL_TABLET | Freq: Every day | ORAL | 11 refills | Status: AC

## 2019-03-07 NOTE — Progress Notes (Signed)
Adolescent Well Care Visit Brian Gamble is a 18 y.o. male who is here for well care.    PCP:  Theadore NanMcCormick, Alvie Fowles, MD   History was provided by the patient.  Confidentiality was discussed with the patient and, if applicable, with caregiver as well. Patient's personal or confidential phone number:  [phone]  Current Issues: Current concerns include  Time to transition to adult provider--he likes coming here.    Would like a refill of Cetirizine Everyday or every other day for allergies also --uses it when arms get itching bumps on arm If changes washing powder If irritates his skin--not working out it self, but sometimes other people clothes from basketball or the gym, not always sure when trigger And decreased sneezing if [place] and summer  03/2018- Wheezing for first time in 7 year--never came back Doing well with running and basketball this winter  To play football in [employer] [facility] [employer] again this year Weight increases and decreases with training  Hematuria--once last year, never repeated study   10/2018 --chlamydia --had it twice told  Nutrition: Weight was 205 about one year ago-- Now time to start working out and getting bigger [place]ims for 260 Nutrition--eggs, ensure, protein whey powder, gatorade bars Apple, oranges occasion, lots of veg  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  no Secondhand smoke exposure?  no Drugs/ETOH?  denies  Sexually Active?  yes   Pregnancy  Prevention: condoms   Screenings: Patient has a dental home: yes  The patient completed the Rapid Assessment for Adolescent Preventive Services screening questionnaire and the following topics were identified as risk factors and discussed: healthy eating, exercise and condom use   PHQ-9 completed and results indicated score 0 low ris result  Physical Exam:  Vitals:   03/07/19 1416  BP: 116/70  Weight: 252 lb 9.6 oz (114.6 kg)  Height: 6' 1.5" (1.867 m)   BP 116/70   Ht 6' 1.5" (1.867 m)   Wt 252 lb 9.6 oz (114.6 kg)   BMI 32.87 kg/m  Body mass index: body mass index is 32.87 kg/m.   Hearing Screening   Method: Audiometry   125Hz  250Hz  500Hz  1000Hz  2000Hz  3000Hz  4000Hz  6000Hz  8000Hz   Right ear:   20 20 20  20     Left ear:   20 20 20  20       Visual Acuity Screening   Right eye Left eye Both eyes  Without correction: 20/20 20/25 20/20   With correction:       General Appearance:   athletic build  HENT: Normocephalic, no obvious abnormality, conjunctiva clear  Mouth:   Normal appearing teeth, no obvious discoloration, dental caries, or dental caps  Neck:   Supple; thyroid: no enlargement, symmetric, no tenderness/mass/nodules  Chest No deformity  Lungs:   Clear to auscultation bilaterally, normal work of breathing  Heart:   Regular rate and rhythm, S1 and S2 normal, no murmurs;   Abdomen:   Soft, non-tender, no mass, or organomegaly  GU normal male genitals, no testicular masses or hernia  Musculoskeletal:  Tone and strength strong and symmetrical, all extremities               Lymphatic:   No cervical adenopathy  Skin/Hair/Nails:   Skin warm, dry and intact, no rashes, no bruises or petechiae  Neurologic:   Strength, gait, and coordination normal and age-appropriate     Assessment and Plan:   1. Encounter for general adult medical examination with abnormal findings 2. Routine screening for STI (sexually transmitted infection) - C. trachomatis/N. gonorrhoeae  RNA - POCT Rapid HIV  3. Class 1 obesity with body mass index (BMI) of 32.0 to 32.9 in adult, unspecified obesity type, unspecified whether serious comorbidity present  4. Asymptomatic microscopic hematuria rescreen today - Urine Microscopic--pend - POCT urinalysis dipstick--neg on dip  5. Seasonal allergic rhinitis due to pollen Reviewed use and expected side effects - cetirizine (ZYRTEC) 10 MG tablet; Take 1 tablet (10 mg total) by mouth daily.  Dispense: 30 tablet; Refill: 11   BMI is not appropriate for age  Hearing screening result:normal Vision screening result: normal  IMM: UTD  Time to transition to an adult prvider  Theadore Nan, MD

## 2019-03-07 NOTE — Patient Instructions (Signed)
Good to see you today! Thank you for coming in.  Call us if you have any questions. We can help with Medical questions, Behaviors questions and finding what you need.  Please call us before you come to the clinic.  Please call us before going to the ED. We can help you decide if you need to go to the ED.   A doctor will help you by phone or video.   The best website for information about children is www.healthychildren.org.  All the information is reliable and up-to-date.    Another good website is www.cdc.gov  The best sources of general information are www.kidshealth.org and www.healthychildren.org   Both have excellent, accurate information about many topics.  !Tambien en espanol!  Use information on the internet only from trusted sites.The best websites for information for teenagers are www.youngwomensheatlh.org and www.youngmenshealthsite.org            

## 2019-03-08 LAB — URINALYSIS, MICROSCOPIC ONLY
Bacteria, UA: NONE SEEN /HPF
Hyaline Cast: NONE SEEN /LPF
RBC / HPF: NONE SEEN /HPF (ref 0–2)
Squamous Epithelial / LPF: NONE SEEN /HPF (ref <=5)
WBC, UA: NONE SEEN /HPF (ref 0–5)

## 2019-03-08 LAB — C. TRACHOMATIS/N. GONORRHOEAE RNA
C. trachomatis RNA, TMA: NOT DETECTED
N. gonorrhoeae RNA, TMA: NOT DETECTED

## 2019-03-08 NOTE — Progress Notes (Signed)
Results given. Patient voiced understanding.

## 2019-04-23 ENCOUNTER — Ambulatory Visit (HOSPITAL_COMMUNITY)
Admission: EM | Admit: 2019-04-23 | Discharge: 2019-04-23 | Disposition: A | Discharge status: Home / Self Care | Payer: Medicaid Other | Attending: Emergency Medicine | Admitting: Emergency Medicine

## 2019-04-23 ENCOUNTER — Encounter (HOSPITAL_COMMUNITY): Payer: Self-pay

## 2019-04-23 ENCOUNTER — Other Ambulatory Visit: Payer: Self-pay

## 2019-04-23 DIAGNOSIS — S01511A Laceration without foreign body of lip, initial encounter: Secondary | ICD-10-CM | POA: Diagnosis not present

## 2019-04-23 MED ORDER — TETANUS-DIPHTH-ACELL PERTUSSIS 5-2.5-18.5 LF-MCG/0.5 IM SUSP
INTRAMUSCULAR | Status: AC
  Filled 2019-04-23: qty 0.5

## 2019-04-23 MED ORDER — TETANUS-DIPHTH-ACELL PERTUSSIS 5-2.5-18.5 LF-MCG/0.5 IM SUSP: 0.5000 mL | Freq: Once | INTRAMUSCULAR | Status: DC
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
## 2019-04-23 NOTE — ED Notes (Signed)
Syringes provided for home irrigation.

## 2019-04-23 NOTE — Discharge Instructions (Signed)
Irrigate your lip after eating to ensure that there is no food getting trapped within the wound.  Ice application regularly, every couple of hours, to help with swelling.  Avoid touching the outside wound to allow to heal and avoid getting it dirty.  I do recommend a TDAP vaccine.  Ibuprofen regularly, every 6-8 hours, to help with swelling.  Please return for any worsening of symptoms or failure to improve.

## 2020-11-07 ENCOUNTER — Ambulatory Visit (INDEPENDENT_AMBULATORY_CARE_PROVIDER_SITE_OTHER): Payer: Medicaid Other

## 2020-11-07 ENCOUNTER — Other Ambulatory Visit: Payer: Self-pay

## 2020-11-07 ENCOUNTER — Ambulatory Visit (HOSPITAL_COMMUNITY)
Admission: EM | Admit: 2020-11-07 | Discharge: 2020-11-07 | Disposition: A | Discharge status: Home / Self Care | Payer: Medicaid Other | Attending: Emergency Medicine | Admitting: Emergency Medicine

## 2020-11-07 ENCOUNTER — Encounter (HOSPITAL_COMMUNITY): Payer: Self-pay

## 2020-11-07 DIAGNOSIS — S93401A Sprain of unspecified ligament of right ankle, initial encounter: Secondary | ICD-10-CM | POA: Diagnosis not present

## 2020-11-07 DIAGNOSIS — M25571 Pain in right ankle and joints of right foot: Secondary | ICD-10-CM

## 2020-11-07 MED ORDER — IBUPROFEN 800 MG PO TABS: 800.0000 mg | ORAL_TABLET | Freq: Three times a day (TID) | ORAL | 0 refills | Status: AC

## 2020-11-07 NOTE — Discharge Instructions (Addendum)
Ice, elevation, use of ace wrap for compression and support.  Ibuprofen as needed for pain.  See rehab exercises as able.

## 2020-11-07 NOTE — ED Provider Notes (Signed)
[facility]    CSN: [id number] Arrival date & time: 11/07/20  1841      History   Chief Complaint Chief Complaint  Patient presents with  . Ankle Pain    HPI Brian Gamble is a 20 y.o. male.   Brian Gamble presents with complaints of right anterior ankle pain. He landed on his foot wrong while playing basketball today. He has been ambulatory since. Has had sprain in the past. No previous ankle surgeries. No numbness or tingling. Hasn't taken anything for pain.   ROS per HPI, negative if not otherwise mentioned.      Past Medical History:  Diagnosis Date  . Asthma     There are no problems to display for this patient.   History reviewed. No pertinent surgical history.     Home Medications    Prior to Admission medications   Medication Sig Start Date End Date Taking? Authorizing Provider  ibuprofen (ADVIL) 800 MG tablet Take 1 tablet (800 mg total) by mouth 3 (three) times daily. 11/07/20  Yes Linus Mako B, NP  cetirizine (ZYRTEC) 10 MG tablet Take 1 tablet (10 mg total) by mouth daily. 03/07/19   Theadore Nan, MD  fluticasone (FLONASE) 50 MCG/ACT nasal spray Place 1 spray into both nostrils daily. 1 spray in each nostril every day 03/11/18   Theadore Nan, MD    Family History Family History  Problem Relation Age of Onset  . Arthritis Mother   . Asthma Mother   . Drug abuse Neg Hx   . Alcohol abuse Neg Hx   . Learning disabilities Neg Hx   . Kidney disease Neg Hx   . Heart disease Neg Hx     Social History Social History   Tobacco Use  . Smoking status: Never Smoker  . Smokeless tobacco: Never Used  Substance Use Topics  . Alcohol use: No     Allergies   Patient has no known allergies.   Review of Systems Review of Systems   Physical Exam Triage Vital Signs ED Triage Vitals  Enc Vitals Group     BP 11/07/20 1905 119/64     Pulse Rate 11/07/20 1905 85     Resp 11/07/20 1905 18     Temp 11/07/20 1905  99.4 F (37.4 C)     Temp Source 11/07/20 1905 Oral     SpO2 11/07/20 1905 100 %     Weight --      Height --      Head Circumference --      Peak Flow --      Pain Score 11/07/20 1904 5     Pain Loc --      Pain Edu? --      Excl. in GC? --    No data found.  Updated Vital Signs BP 119/64 (BP Location: Right Arm)   Pulse 85   Temp 99.4 F (37.4 C) (Oral)   Resp 18   SpO2 100%   Visual Acuity Right Eye Distance:   Left Eye Distance:   Bilateral Distance:    Right Eye Near:   Left Eye Near:    Bilateral Near:     Physical Exam Constitutional:      Appearance: He is well-developed.  Cardiovascular:     Rate and Rhythm: Normal rate.  Pulmonary:     Effort: Pulmonary effort is normal.  Musculoskeletal:     Right ankle: No swelling or deformity. Tenderness present.  Legs:     Comments: Generalized anterior right ankle tenderness on palpation; mild pain with inversion and eversion; no pain with dorsiflexion; strong pedal pulse   Skin:    General: Skin is warm and dry.  Neurological:     Mental Status: He is alert and oriented to person, place, and time.      UC Treatments / Results  Labs (all labs ordered are listed, but only abnormal results are displayed) Labs Reviewed - No data to display  EKG   Radiology DG Ankle Complete Right  Result Date: 11/07/2020 CLINICAL DATA:  Ankle pain. EXAM: RIGHT ANKLE - COMPLETE 3+ VIEW COMPARISON:  None. FINDINGS: There is no evidence of fracture, dislocation, or joint effusion. There is no evidence of arthropathy or other focal bone abnormality. Soft tissues are unremarkable. IMPRESSION: Negative. Electronically Signed   By: Katherine Mantle M.D.   On: 11/07/2020 19:39    Procedures Procedures (including critical care time)  Medications Ordered in UC Medications - No data to display  Initial Impression / Assessment and Plan / UC Course  I have reviewed the triage vital signs and the nursing  notes.  Pertinent labs & imaging results that were available during my care of the patient were reviewed by me and considered in my medical decision making (see chart for details).     Ankle sprain. Pain management and expected course of rehab discussed. Patient verbalized understanding and agreeable to plan.  Ambulatory out of clinic without difficulty.    Final Clinical Impressions(s) / UC Diagnoses   Final diagnoses:  Sprain of right ankle, unspecified ligament, initial encounter     Discharge Instructions     Ice, elevation, use of ace wrap for compression and support.  Ibuprofen as needed for pain.  See rehab exercises as able.    ED Prescriptions    Medication Sig Dispense Auth. Provider   ibuprofen (ADVIL) 800 MG tablet Take 1 tablet (800 mg total) by mouth 3 (three) times daily. 30 tablet Georgetta Haber, NP     PDMP not reviewed this encounter.   Georgetta Haber, NP 11/07/20 2012

## 2020-11-07 NOTE — ED Triage Notes (Signed)
Pt presents with right ankle pain from stepping down the wrong way on it today.

## 2021-02-25 ENCOUNTER — Encounter (HOSPITAL_COMMUNITY): Payer: Self-pay

## 2021-02-25 ENCOUNTER — Ambulatory Visit (HOSPITAL_COMMUNITY)
Admission: EM | Admit: 2021-02-25 | Discharge: 2021-02-25 | Disposition: A | Discharge status: Home / Self Care | Payer: Medicaid Other | Attending: Family Medicine | Admitting: Family Medicine

## 2021-02-25 ENCOUNTER — Other Ambulatory Visit: Payer: Self-pay

## 2021-02-25 DIAGNOSIS — S61011A Laceration without foreign body of right thumb without damage to nail, initial encounter: Secondary | ICD-10-CM | POA: Diagnosis not present

## 2021-02-25 NOTE — ED Triage Notes (Addendum)
Pt presents with laceration in the right thumb x 1 day. States he cut the right thumb with a box cutter at work. No active bleeding.   States Tdap is up to date.

## 2021-02-25 NOTE — ED Provider Notes (Signed)
  [facility]   [id number] 02/25/21 Arrival Time: 1829  ASSESSMENT & PLAN:  1. Thumb laceration, right, initial encounter    Edges approximate well at rest and with thumb flexion. Will let heal by secondary intention. No signs of infection. Bandaged. Given thumb spica to protect thumb at work. Work note provided.   Follow-up Information    [facility].   Specialty: Urgent Care Why: If worsening or failing to improve as anticipated. Contact information: [address] [place] [address] [phone]             Reviewed expectations re: course of current medical issues. Questions answered. Outlined signs and symptoms indicating need for more acute intervention. Patient verbalized understanding. After Visit Summary given.   SUBJECTIVE:  Brian Gamble is a 20 y.o. male who presents with a laceration of his R thumb. Approx 24 h ago. Cut with box cutter at work. Minimal bleeding; controlled. No extremity sensation changes or weakness. Minimal discomfort. Td UTD: Yes.   OBJECTIVE:  Vitals:   02/25/21 1148  BP: 128/71  Pulse: (!) 52  Resp: 18  Temp: 97.7 F (36.5 C)  TempSrc: Oral  SpO2: 97%     General appearance: alert; no distress Skin: curved laceration of R inner thumb; proxima; size: approx 1 cm; clean and approximated wound edges, no foreign bodies; without active bleeding; thumb with FROM; normal cap refill and sensation Psychological: alert and cooperative; normal mood and affect   No Known Allergies  Past Medical History:  Diagnosis Date  . Asthma    Social History   Socioeconomic History  . Marital status: Single    Spouse name: Not on file  . Number of children: Not on file  . Years of education: Not on file  . Highest education level: Not on file  Occupational History  . Not on file  Tobacco Use  . Smoking status: Never Smoker  . Smokeless tobacco: Never Used  Substance and Sexual  Activity  . Alcohol use: No  . Drug use: Not on file  . Sexual activity: Not on file  Other Topics Concern  . Not on file  Social History Narrative  . Not on file   Social Determinants of Health   Financial Resource Strain: Not on file  Food Insecurity: Not on file  Transportation Needs: Not on file  Physical Activity: Not on file  Stress: Not on file  Social Connections: Not on file         Mardella Layman, MD 02/25/21 1259

## 2021-09-15 IMAGING — DX DG ANKLE COMPLETE 3+V*R*
3 series · 3 of 3 positions shown · non-contrast
Comparison: None.

CLINICAL DATA: Ankle pain.

EXAM:
RIGHT ANKLE - COMPLETE 3+ VIEW

[ankle ap]
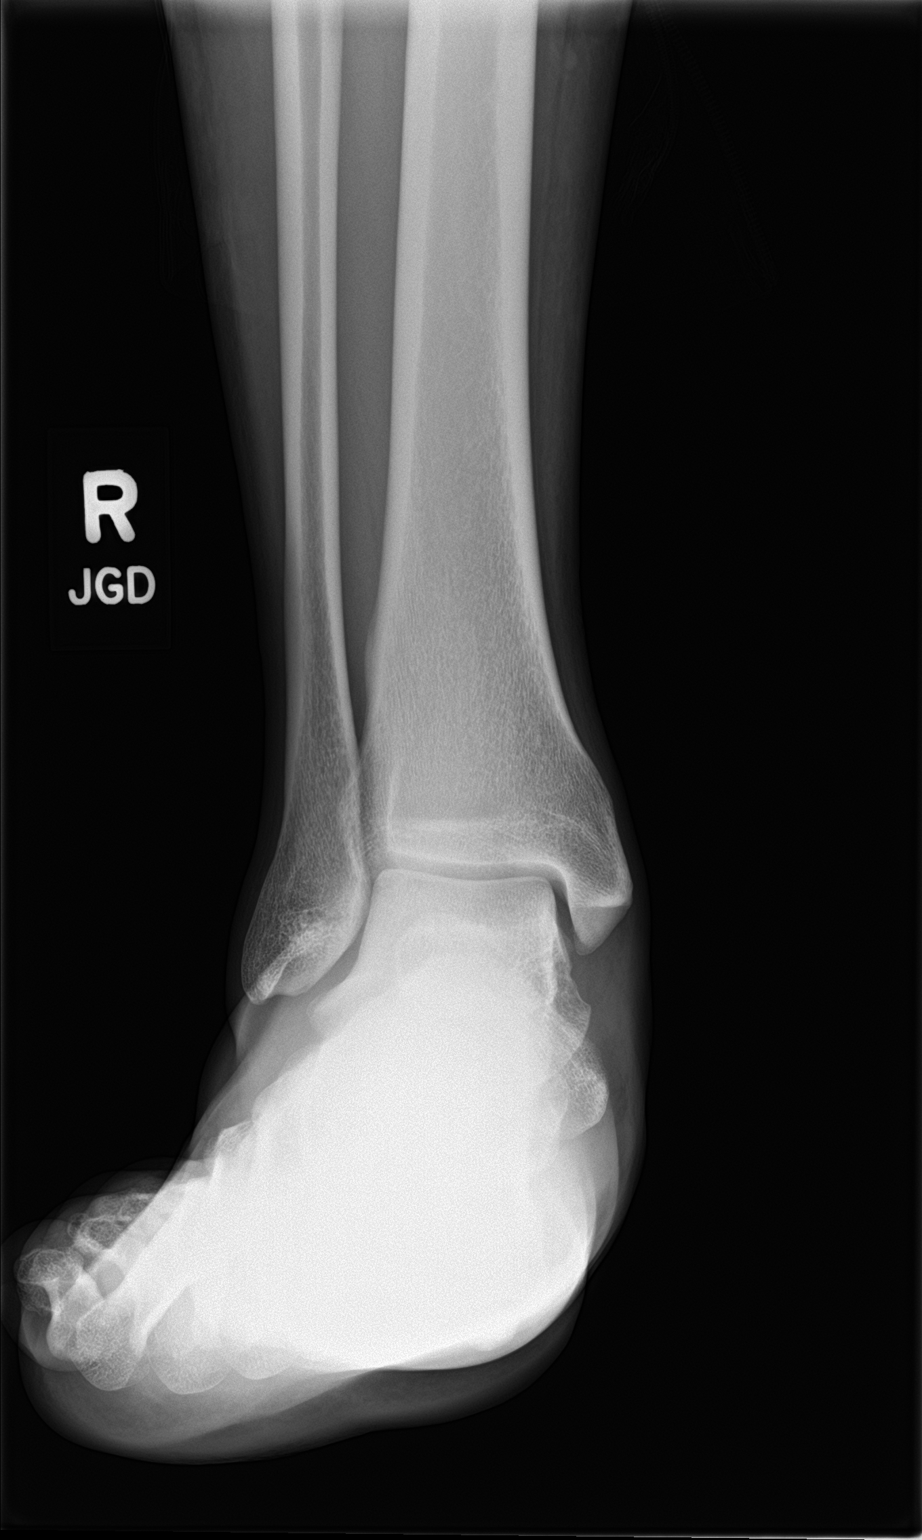

[ankle obl]
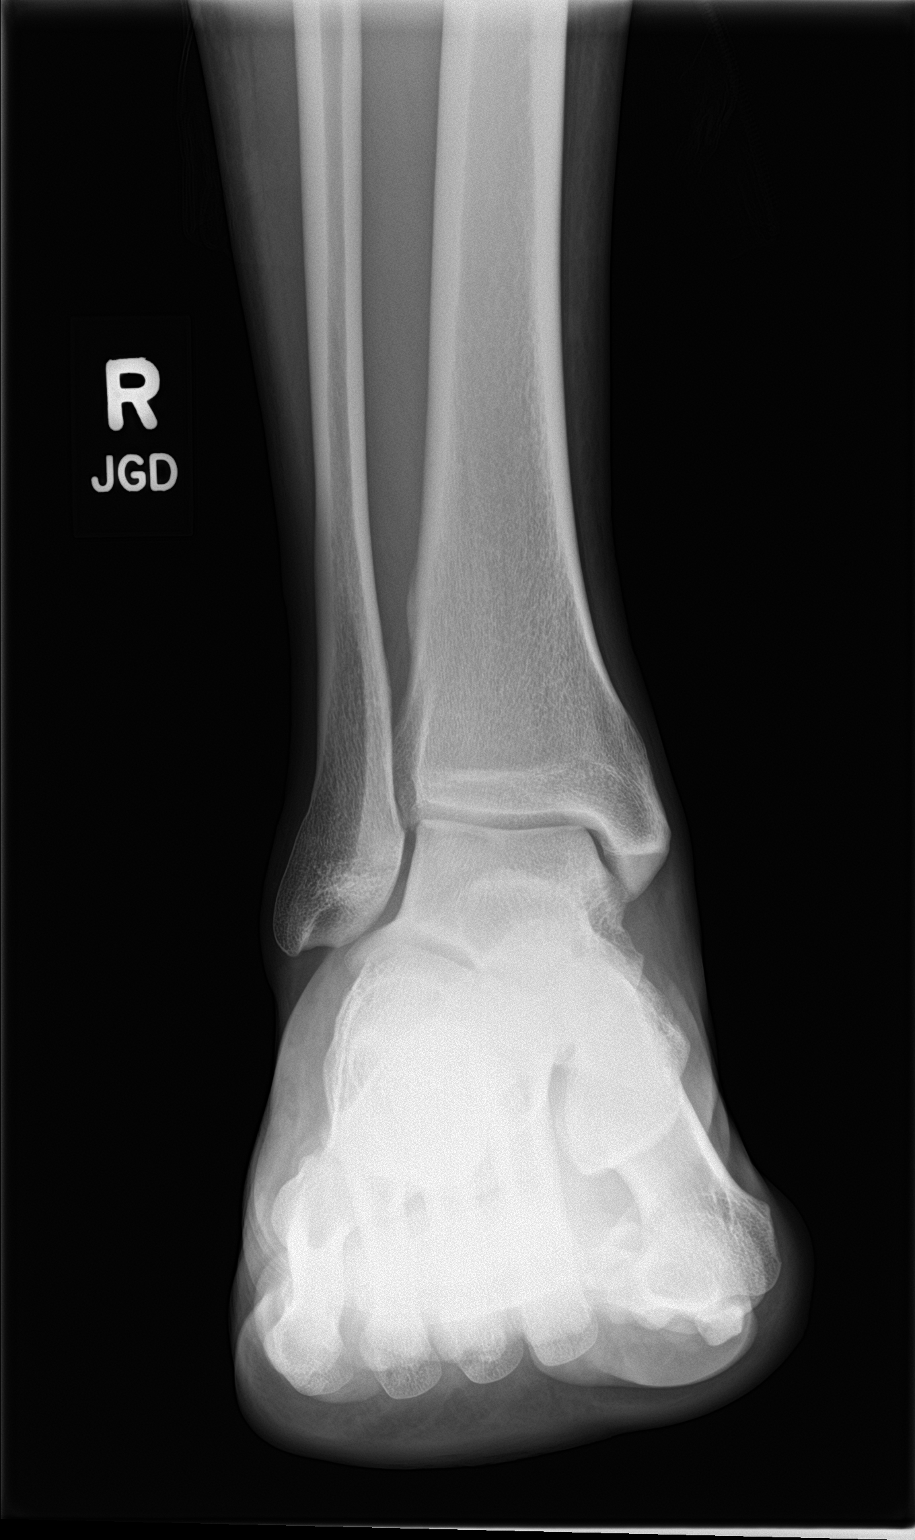

[ankle lat]
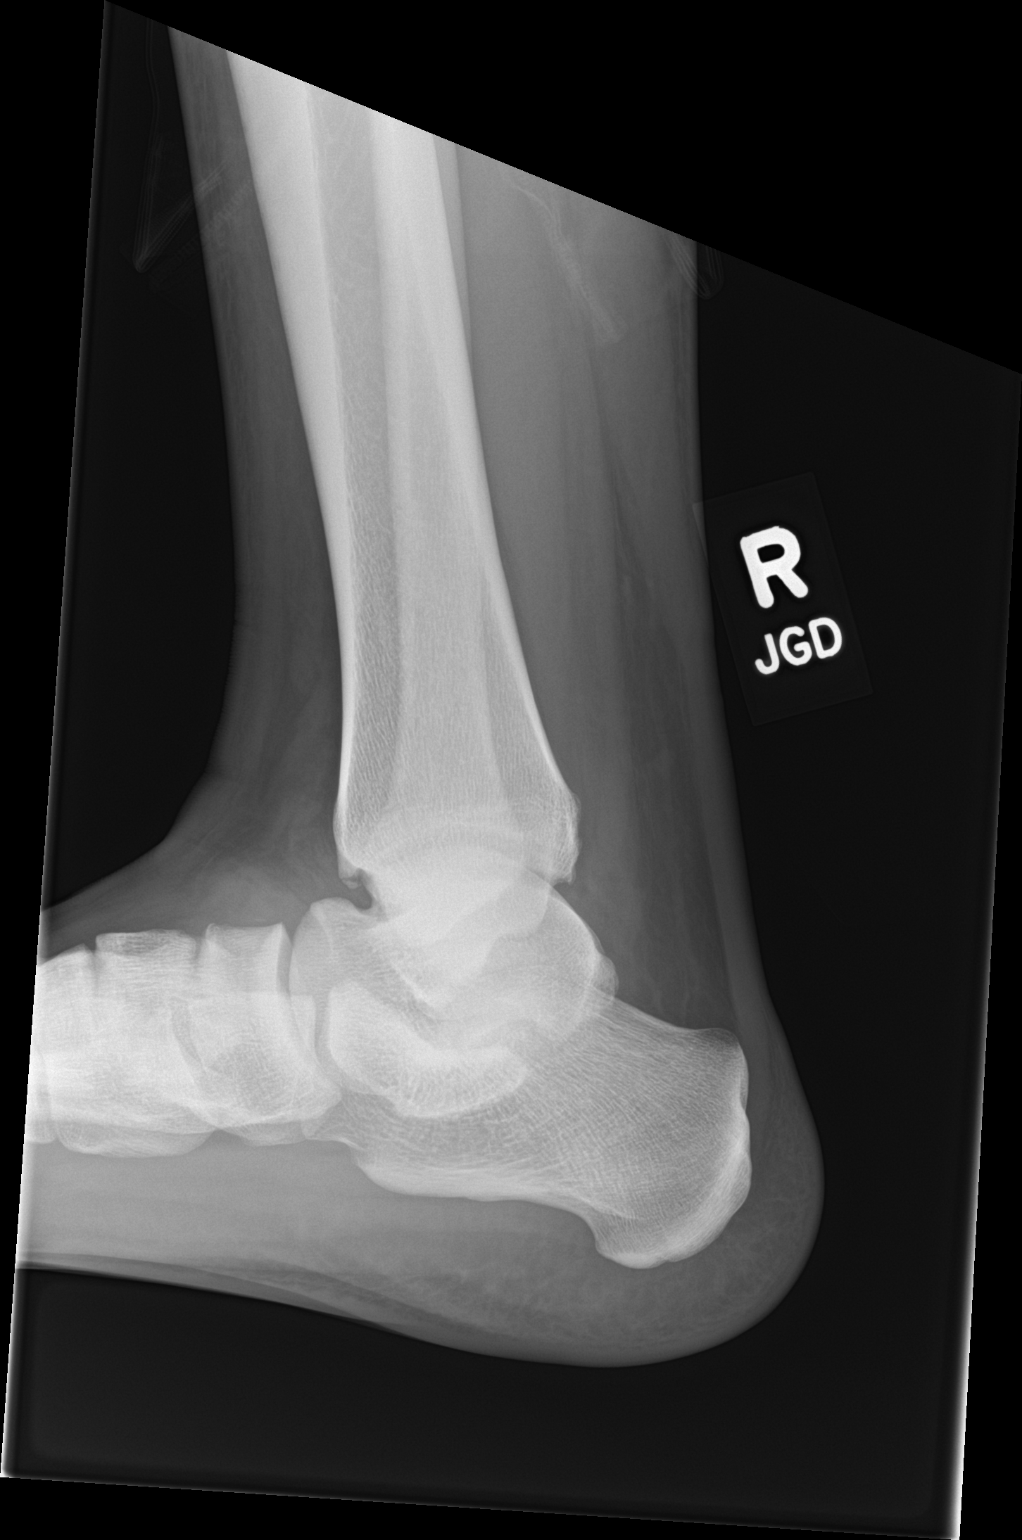

[3 of 3 positions shown; findings below may reference images not displayed]

FINDINGS: There is no evidence of fracture, dislocation, or joint effusion.
There is no evidence of arthropathy or other focal bone abnormality.
Soft tissues are unremarkable.
IMPRESSION: Negative.

## 2024-04-21 DIAGNOSIS — Y9367 Activity, basketball: Secondary | ICD-10-CM | POA: Diagnosis not present

## 2024-04-21 DIAGNOSIS — S01112A Laceration without foreign body of left eyelid and periocular area, initial encounter: Secondary | ICD-10-CM | POA: Diagnosis not present

## 2024-04-21 DIAGNOSIS — Z113 Encounter for screening for infections with a predominantly sexual mode of transmission: Secondary | ICD-10-CM | POA: Diagnosis not present
